# Patient Record
Sex: Female | Born: 1965 | Hispanic: Yes | Marital: Married | Smoking: Never smoker
Health system: Southern US, Community
[De-identification: ages and names within clinical notes are randomized; demographics above are authoritative.]

## PROBLEM LIST (undated history)

## (undated) DIAGNOSIS — I10 Essential (primary) hypertension: Secondary | ICD-10-CM

## (undated) HISTORY — DX: Essential (primary) hypertension: I10

---

## 2013-06-15 ENCOUNTER — Ambulatory Visit: Payer: Self-pay | Admitting: Family Medicine

## 2014-07-02 ENCOUNTER — Other Ambulatory Visit: Payer: Self-pay | Admitting: Oncology

## 2014-07-02 DIAGNOSIS — Z1231 Encounter for screening mammogram for malignant neoplasm of breast: Secondary | ICD-10-CM

## 2014-07-26 ENCOUNTER — Ambulatory Visit: Payer: Self-pay

## 2014-08-30 ENCOUNTER — Ambulatory Visit
Admission: RE | Admit: 2014-08-30 | Discharge: 2014-08-30 | Disposition: A | Discharge status: Home / Self Care | Payer: Self-pay | Source: Ambulatory Visit | Attending: Oncology | Admitting: Oncology

## 2014-08-30 ENCOUNTER — Ambulatory Visit: Payer: Self-pay | Attending: Oncology

## 2014-08-30 VITALS — BP 130/84 | HR 71 | Temp 97.9°F | Resp 16 | Ht 61.02 in | Wt 242.0 lb

## 2014-08-30 DIAGNOSIS — Z1231 Encounter for screening mammogram for malignant neoplasm of breast: Secondary | ICD-10-CM

## 2014-08-30 DIAGNOSIS — Z0000 Encounter for general adult medical examination without abnormal findings: Secondary | ICD-10-CM

## 2014-08-30 NOTE — Progress Notes (Signed)
Subjective:     Patient ID: Marcia Roberts, female   DOB: 23-Nov-1965, 49 y.o.   MRN: [id number]  HPI   Review of Systems     Objective:   Physical Exam  Pulmonary/Chest: Right breast exhibits no inverted nipple, no mass, no nipple discharge, no skin change and no tenderness. Left breast exhibits no inverted nipple, no mass, no nipple discharge, no skin change and no tenderness. Breasts are symmetrical.       Assessment:  49 year old hispanic patient presents for [facility] clinic visit. Patient screened, and meets [facility] eligibility.  Patient does not have insurance, Medicare or Medicaid.  Handout given on Affordable Care Act. CBE unremarkable.  Instructed patient on breast self-exam using teach back method.  [name] interpreted exam.    Plan:     Sent for bilateral screening mammogram.

## 2014-09-01 NOTE — Progress Notes (Signed)
Letter mailed from [facility] to notify of normal mammogram results.  Patient to return in one year for annual screening.Patient to return in one year for annual screening.  Copy to [facility]. 

## 2018-02-26 ENCOUNTER — Ambulatory Visit
Admission: RE | Admit: 2018-02-26 | Discharge: 2018-02-26 | Disposition: A | Discharge status: Home / Self Care | Payer: Self-pay | Source: Ambulatory Visit | Attending: Oncology | Admitting: Oncology

## 2018-02-26 ENCOUNTER — Ambulatory Visit: Payer: Self-pay | Attending: Oncology | Admitting: *Deleted

## 2018-02-26 ENCOUNTER — Encounter: Payer: Self-pay | Admitting: *Deleted

## 2018-02-26 ENCOUNTER — Other Ambulatory Visit: Payer: Self-pay | Admitting: *Deleted

## 2018-02-26 ENCOUNTER — Encounter (INDEPENDENT_AMBULATORY_CARE_PROVIDER_SITE_OTHER): Payer: Self-pay

## 2018-02-26 VITALS — BP 137/92 | HR 96 | Temp 95.8°F | Ht 61.42 in | Wt 235.4 lb

## 2018-02-26 DIAGNOSIS — N63 Unspecified lump in unspecified breast: Secondary | ICD-10-CM

## 2018-02-26 DIAGNOSIS — Z0000 Encounter for general adult medical examination without abnormal findings: Secondary | ICD-10-CM

## 2018-02-26 NOTE — Patient Instructions (Signed)
Gave patient hand-out, Women Staying Healthy, Active and Well from [facility], with education on breast health, pap smears, heart and colon health. 

## 2018-02-26 NOTE — Progress Notes (Signed)
  Subjective:     Patient ID: Catalina PizzaMaria Fabiana Alvarez Roque, female   DOB: 1966/02/13, 52 y.o.   MRN: [id number]  HPI   Review of Systems     Objective:   Physical Exam  Pulmonary/Chest: Right breast exhibits no inverted nipple, no mass, no nipple discharge, no skin change and no tenderness. Left breast exhibits no inverted nipple, no mass, no nipple discharge, no skin change and no tenderness.       Assessment:     52 [facility] Hispanic [facility]  Lloyda, the interpreter present during the interview and exam.  Clinical breast exam unremarkable.  Taught self breast awareness.  Last pap on 07/08/17 was negative / negative.  Patient [facility].  Patient has been screened for eligibility.  She does not have any insurance, Medicare or Medicaid.  She also meets financial eligibility.  Hand-out given on the Affordable Care Act.  Risk Assessment    Risk Scores      02/26/2018   Last edited by: Alta Corningover, Melissa G, CMA   5-year risk: 1.4 %   Lifetime risk: 12.5 %            Plan:     Screening mammogram ordered.  Will follow-up per [facility] protocol.

## 2018-03-06 ENCOUNTER — Ambulatory Visit: Admission: RE | Admit: 2018-03-06 | Payer: Self-pay | Source: Ambulatory Visit

## 2018-03-07 ENCOUNTER — Ambulatory Visit
Admission: RE | Admit: 2018-03-07 | Discharge: 2018-03-07 | Disposition: A | Discharge status: Home / Self Care | Payer: Self-pay | Source: Ambulatory Visit | Attending: Oncology | Admitting: Oncology

## 2018-03-07 DIAGNOSIS — N63 Unspecified lump in unspecified breast: Secondary | ICD-10-CM | POA: Insufficient documentation

## 2018-03-21 ENCOUNTER — Encounter: Payer: Self-pay | Admitting: *Deleted

## 2018-03-21 NOTE — Progress Notes (Signed)
Letter mailed from [facility] to inform patient of her normal mammogram results.  Patient is to follow-up with annual screening in one year.  [facility] to Christy. 

## 2018-04-02 ENCOUNTER — Ambulatory Visit: Payer: Self-pay

## 2020-02-14 IMAGING — MG DIGITAL SCREENING BILATERAL MAMMOGRAM WITH TOMO AND CAD
6 of 10 series · 6 of 30 positions shown · non-contrast
Comparison: Previous exam(s).

CLINICAL DATA: Screening.

EXAM:
DIGITAL SCREENING BILATERAL MAMMOGRAM WITH TOMO AND CAD

[L CC synth-2D]
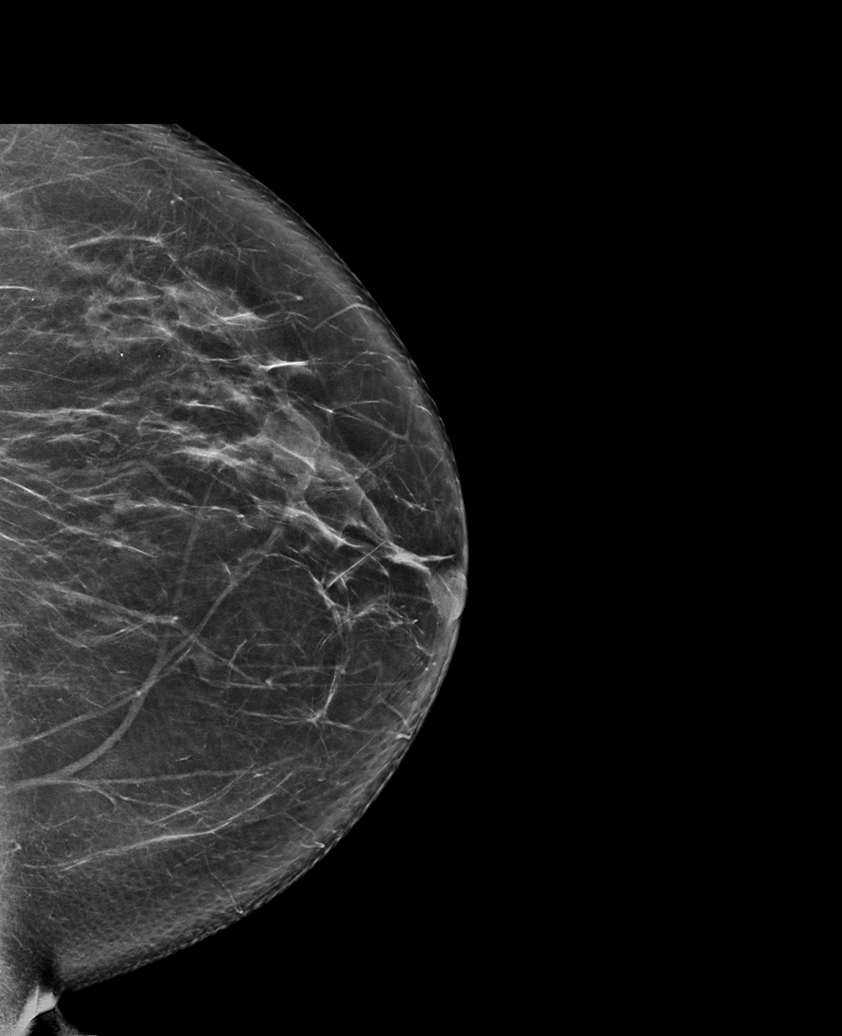

[R CC synth-2D]
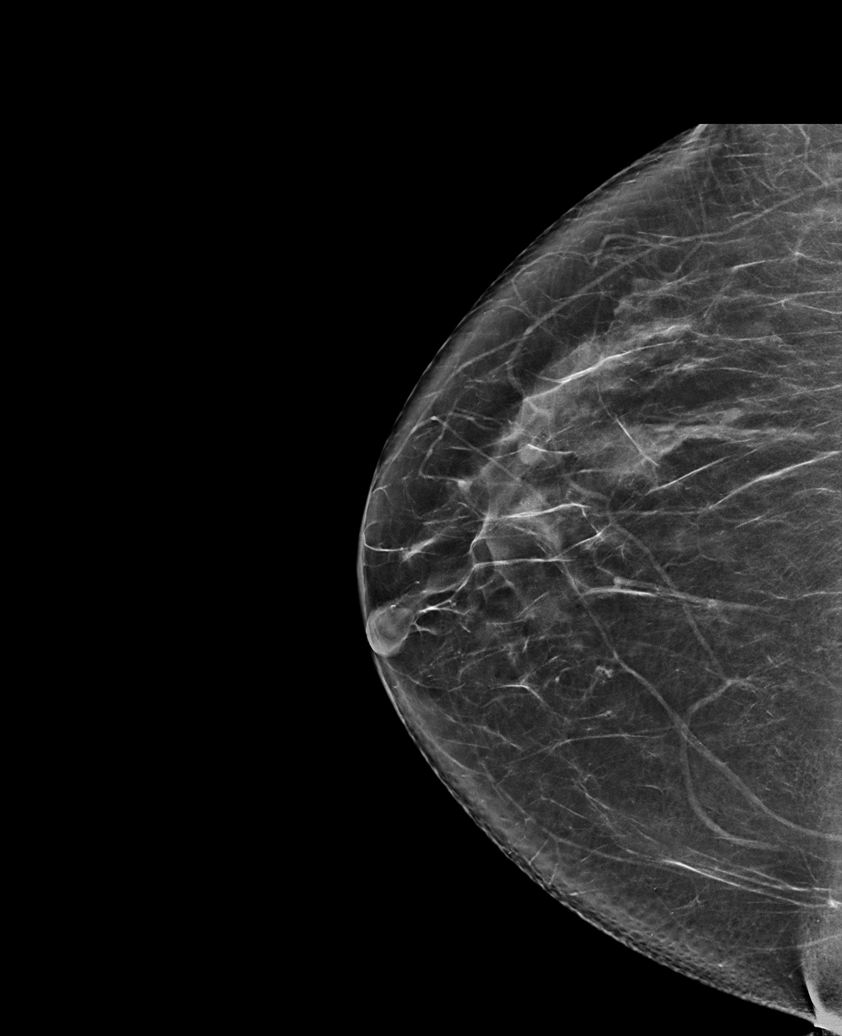

[L MLO synth-2D (1 of 2)]
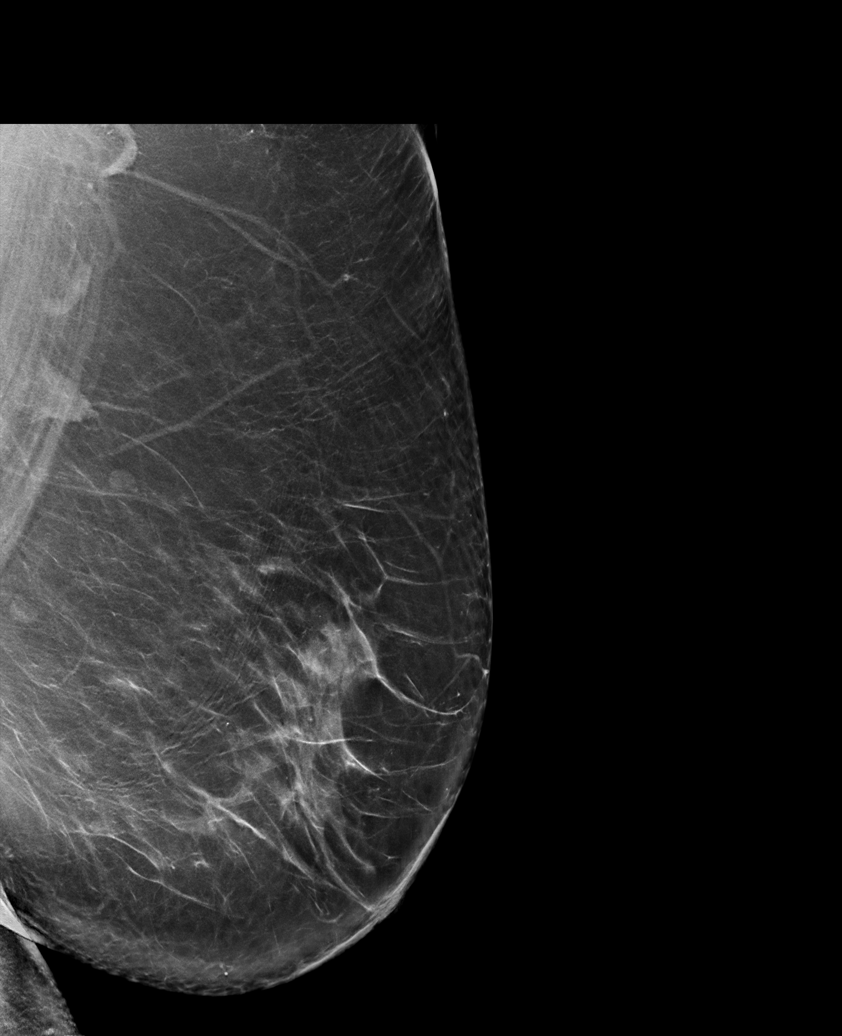

[R MLO synth-2D]
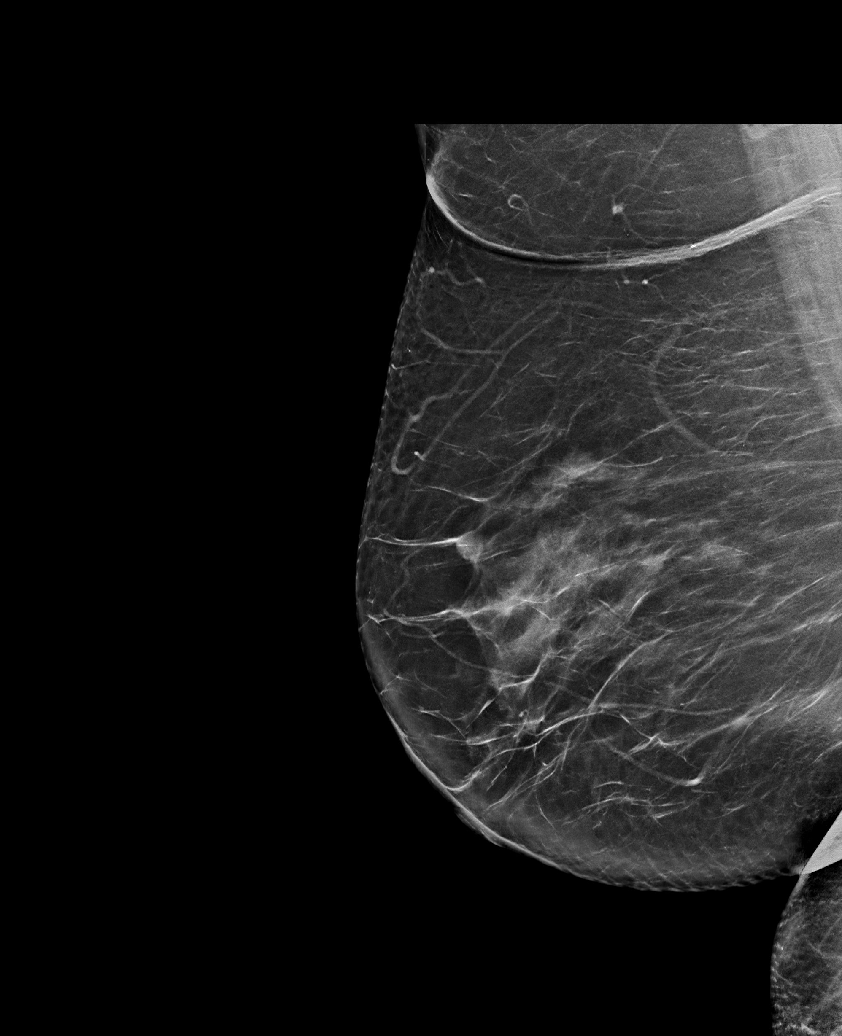

[L MLO synth-2D (2 of 2)]
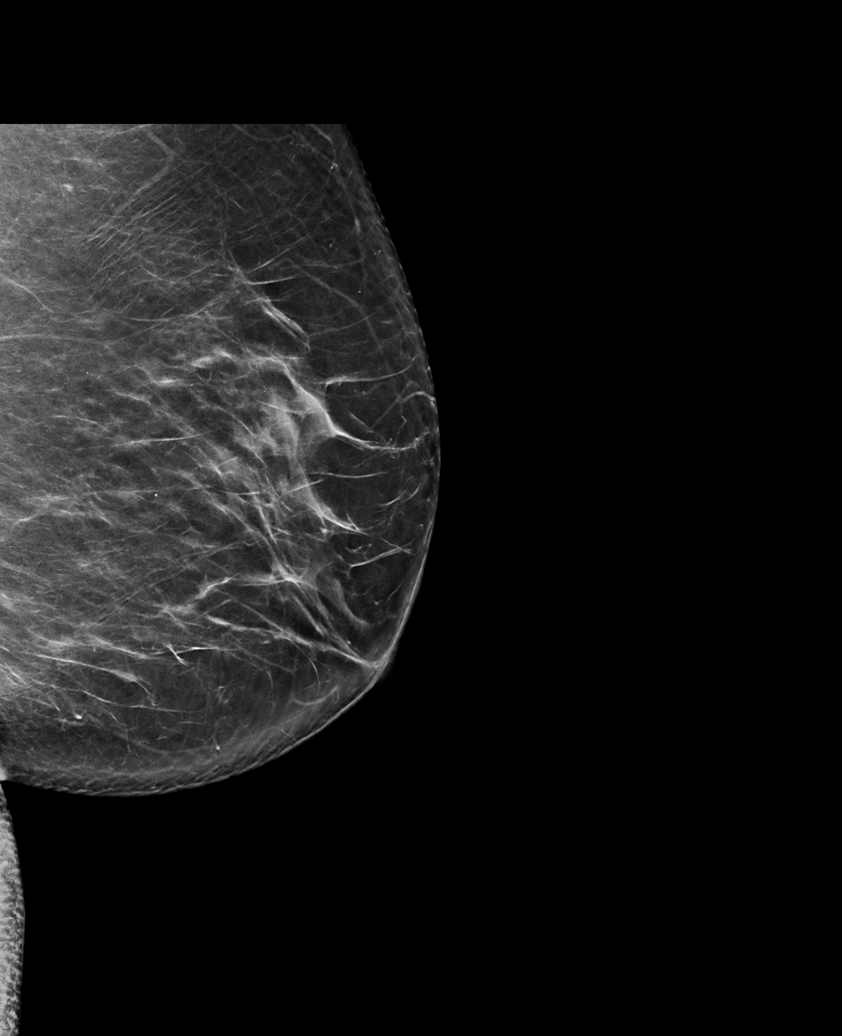

[L MLO tomo · tomo slice 44/87.0]
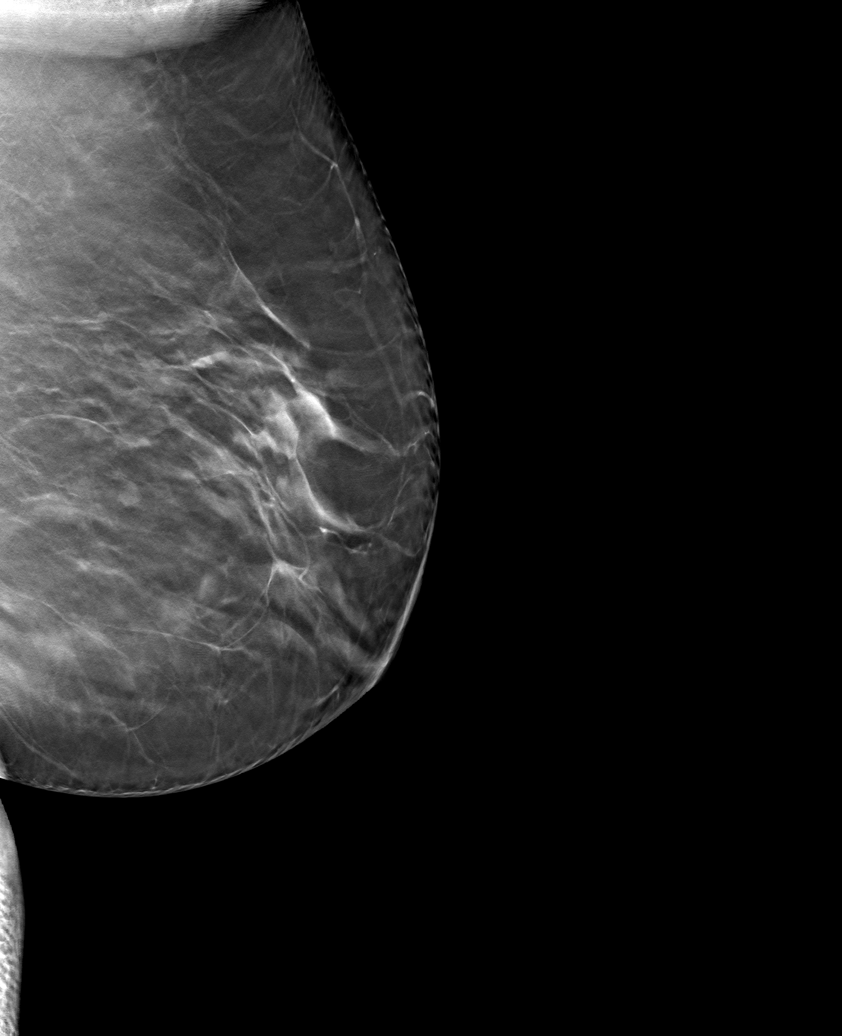

[6 of 30 positions shown; findings below may reference images not displayed]

ACR Breast Density Category b: There are scattered areas of
fibroglandular density.
FINDINGS: In the right breast, a possible mass warrants further evaluation. In
the left breast, no findings suspicious for malignancy. Images were
processed with CAD.
IMPRESSION: Further evaluation is suggested for possible mass in the right
breast.

RECOMMENDATION:
Ultrasound of the right breast. (Code:[id number]-[id number])

The patient will be contacted regarding the findings, and additional
imaging will be scheduled.

BI-RADS CATEGORY  0: Incomplete. Need additional imaging evaluation
and/or prior mammograms for comparison.

## 2021-12-25 ENCOUNTER — Ambulatory Visit: Payer: Self-pay

## 2022-01-18 ENCOUNTER — Other Ambulatory Visit: Payer: Self-pay

## 2022-01-18 DIAGNOSIS — N644 Mastodynia: Secondary | ICD-10-CM

## 2022-01-23 ENCOUNTER — Ambulatory Visit: Payer: Self-pay

## 2022-01-24 ENCOUNTER — Other Ambulatory Visit: Payer: Self-pay | Admitting: *Deleted

## 2022-01-24 ENCOUNTER — Inpatient Hospital Stay
Admission: RE | Admit: 2022-01-24 | Discharge: 2022-01-24 | Disposition: A | Discharge status: Home / Self Care | Payer: Self-pay | Source: Ambulatory Visit | Attending: *Deleted | Admitting: *Deleted

## 2022-01-24 DIAGNOSIS — Z1231 Encounter for screening mammogram for malignant neoplasm of breast: Secondary | ICD-10-CM

## 2022-03-05 ENCOUNTER — Ambulatory Visit
Admission: RE | Admit: 2022-03-05 | Discharge: 2022-03-05 | Disposition: A | Discharge status: Home / Self Care | Payer: Self-pay | Source: Ambulatory Visit | Attending: Obstetrics and Gynecology | Admitting: Obstetrics and Gynecology

## 2022-03-05 ENCOUNTER — Ambulatory Visit: Payer: Self-pay | Attending: Hematology and Oncology | Admitting: Hematology and Oncology

## 2022-03-05 VITALS — BP 134/77 | Wt 232.4 lb

## 2022-03-05 DIAGNOSIS — N644 Mastodynia: Secondary | ICD-10-CM

## 2022-03-05 NOTE — Progress Notes (Signed)
[name] is a 56 y.o. female who presents to [facility] clinic today with complaint of right breast pain.    Pap Smear: Pap not smear completed today. Last Pap smear was 2021 at [facility] clinic and was normal. Per patient has no history of an abnormal Pap smear. Last Pap smear result is not available in Epic.   Physical exam: Breasts Breasts symmetrical. No skin abnormalities bilateral breasts. No nipple retraction bilateral breasts. No nipple discharge bilateral breasts. No lymphadenopathy. No lumps palpated bilateral breasts.     MS DIGITAL SCREENING TOMO BILATERAL  Result Date: 02/26/2018 CLINICAL DATA:  Screening. EXAM: DIGITAL SCREENING BILATERAL MAMMOGRAM WITH TOMO AND CAD COMPARISON:  Previous exam(s). ACR Breast Density Category b: There are scattered areas of fibroglandular density. FINDINGS: In the right breast, a possible mass warrants further evaluation. In the left breast, no findings suspicious for malignancy. Images were processed with CAD. IMPRESSION: Further evaluation is suggested for possible mass in the right breast. RECOMMENDATION: Ultrasound of the right breast. (Code:[id number]) The patient will be contacted regarding the findings, and additional imaging will be scheduled. BI-RADS CATEGORY  0: Incomplete. Need additional imaging evaluation and/or prior mammograms for comparison. Electronically Signed   By: Baird Lyons M.D.   On: 02/26/2018 12:44      Pelvic/Bimanual Pap is not indicated today    Smoking History: Patient has never smoked and was not referred to quit line.    Patient Navigation: Patient education provided. Access to services provided for patient through [facility] program. Family member interpreter provided. No transportation provided   Colorectal Cancer Screening: Per patient has never had colonoscopy completed No complaints today. FIT test negative 2023 per [facility]   Breast and Cervical Cancer Risk Assessment: Patient  does not have family history of breast cancer, known genetic mutations, or radiation treatment to the chest before age 54. Patient does not have history of cervical dysplasia, immunocompromised, or DES exposure in-utero.  Risk Scores as of 03/05/2022     Marcia Roberts           5-year 0.9 %   Lifetime 6.67 %   This patient is Hispana/Latina but has no documented birth country, so the [place] model used data from [place] patients to calculate their risk score. Document a birth country in the Demographics activity for a more accurate score.         Last calculated by Narda Rutherford, LPN on 40/98/1191 at 12:47 PM        A: [facility] exam without pap smear Complaint of right breast pain with benign exam.   P: [facility]. Appointment scheduled 03/05/2022.  Ilda Basset A, NP 03/05/2022 1:05 PM

## 2022-03-05 NOTE — Patient Instructions (Signed)
Taught Marcia Roberts about self breast awareness and gave educational materials to take home. Patient did not need a Pap smear today due to last Pap smear was in 2021 per patient.Let her know [facility] will cover Pap smears every 5 years unless has a history of abnormal Pap smears. [facility] to [facility] for [facility]. Appointment scheduled for 03/05/2022. Patient aware of appointment and will be there. Let patient know will follow up with her within the next couple weeks with results. Marcia Roberts verbalized understanding.  Pascal Lux, NP 1:14 PM

## 2022-04-03 NOTE — Progress Notes (Signed)
Error

## 2024-01-09 ENCOUNTER — Other Ambulatory Visit: Payer: Self-pay | Admitting: Family Medicine

## 2024-01-09 DIAGNOSIS — E279 Disorder of adrenal gland, unspecified: Secondary | ICD-10-CM

## 2024-01-14 ENCOUNTER — Ambulatory Visit: Payer: Self-pay
